# Patient Record
Sex: Male | Born: 2000 | Race: Black or African American | Hispanic: No | Marital: Single | Smoking: Never smoker
Health system: Southern US, Community
[De-identification: ages and names within clinical notes are randomized; demographics above are authoritative.]

## PROBLEM LIST (undated history)

## (undated) DIAGNOSIS — N390 Urinary tract infection, site not specified: Secondary | ICD-10-CM

## (undated) DIAGNOSIS — L210 Seborrhea capitis: Secondary | ICD-10-CM

## (undated) DIAGNOSIS — T7840XA Allergy, unspecified, initial encounter: Secondary | ICD-10-CM

## (undated) DIAGNOSIS — N3944 Nocturnal enuresis: Secondary | ICD-10-CM

## (undated) DIAGNOSIS — J45909 Unspecified asthma, uncomplicated: Secondary | ICD-10-CM

## (undated) DIAGNOSIS — F909 Attention-deficit hyperactivity disorder, unspecified type: Secondary | ICD-10-CM

## (undated) HISTORY — DX: Attention-deficit hyperactivity disorder, unspecified type: F90.9

## (undated) HISTORY — DX: Nocturnal enuresis: N39.44

## (undated) HISTORY — DX: Allergy, unspecified, initial encounter: T78.40XA

## (undated) HISTORY — DX: Urinary tract infection, site not specified: N39.0

## (undated) HISTORY — DX: Unspecified asthma, uncomplicated: J45.909

## (undated) HISTORY — PX: WISDOM TOOTH EXTRACTION: SHX21

## (undated) HISTORY — DX: Seborrhea capitis: L21.0

---
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
## 2020-01-11 ENCOUNTER — Emergency Department (HOSPITAL_COMMUNITY)
Admission: EM | Admit: 2020-01-11 | Discharge: 2020-01-11 | Disposition: A | Discharge status: Home / Self Care | Payer: No Typology Code available for payment source | Attending: Emergency Medicine | Admitting: Emergency Medicine

## 2020-01-11 ENCOUNTER — Encounter (HOSPITAL_COMMUNITY): Payer: Self-pay

## 2020-01-11 ENCOUNTER — Other Ambulatory Visit: Payer: Self-pay

## 2020-01-11 DIAGNOSIS — X500XXA Overexertion from strenuous movement or load, initial encounter: Secondary | ICD-10-CM | POA: Diagnosis not present

## 2020-01-11 DIAGNOSIS — Z7951 Long term (current) use of inhaled steroids: Secondary | ICD-10-CM | POA: Insufficient documentation

## 2020-01-11 DIAGNOSIS — S76212A Strain of adductor muscle, fascia and tendon of left thigh, initial encounter: Secondary | ICD-10-CM | POA: Diagnosis not present

## 2020-01-11 DIAGNOSIS — J4540 Moderate persistent asthma, uncomplicated: Secondary | ICD-10-CM | POA: Insufficient documentation

## 2020-01-11 DIAGNOSIS — Y9389 Activity, other specified: Secondary | ICD-10-CM | POA: Insufficient documentation

## 2020-01-11 DIAGNOSIS — Y990 Civilian activity done for income or pay: Secondary | ICD-10-CM | POA: Insufficient documentation

## 2020-01-11 DIAGNOSIS — Y92513 Shop (commercial) as the place of occurrence of the external cause: Secondary | ICD-10-CM | POA: Insufficient documentation

## 2020-01-11 DIAGNOSIS — R1032 Left lower quadrant pain: Secondary | ICD-10-CM | POA: Diagnosis present

## 2020-01-11 NOTE — ED Triage Notes (Addendum)
Patient states he hurt his left groin after lifting a heavy box at work today.

## 2020-01-11 NOTE — Discharge Instructions (Signed)
You have been seen here for groin pain, I recommend taking over-the-counter pain medications like ibuprofen and/or Tylenol every 6 as needed.  Please follow dosage and on the back of bottle.  I also recommend applying ice to the area as this can decrease swelling inflammation.  I would avoid lifting heavy objects for the first week and allow your body to rest.  I would follow-up with your PCP for further evaluation management.  Come back to the emergency department if you develop chest pain, shortness of breath, severe abdominal pain, uncontrolled nausea, vomiting, diarrhea.

## 2020-01-11 NOTE — ED Provider Notes (Signed)
[facility]-EMERGENCY DEPT Provider Note   CSN: [id number] Arrival date & time: 01/11/20  1631     History Chief Complaint  Patient presents with  . Groin Injury    Cameron Stafford is a 19 y.o. male.  HPI   Patient with no significant medical history presents to the emergency department with chief complaint of left-sided groin pain.  Patient states he works at [employer] where he was lifting up heavy boxes today.  He states while lifting a heavy box he felt pain in his left groin.  He states he continued with work but the pain worsened with walking and sitting down.  He denies dysuria, urinary retention, urinary urgency, hematuria, penile discharge, testicular pain.  He denies hitting his head, lose conscious, being on any anticoags.  He denies seeing any bulge in his groin area and is not taking any pain medications.  Patient denies headache, fever, chills, shortness of breath, chest pain, dumping, nausea, vomiting, diarrhea, pedal edema.  Past Medical History:  Diagnosis Date  . ADHD (attention deficit hyperactivity disorder)   . Allergy   . Asthma   . Dandruff 01/16/2013  . Nocturnal enuresis   . Urinary tract infection     Patient Active Problem List   Diagnosis Date Noted  . Moderate persistent asthma 05/23/2015  . Allergic rhinitis due to pollen 05/23/2015  . Inadequate sleep hygiene 12/30/2014  . Asthma, moderate persistent, well-controlled 02/23/2014  . Allergic rhinitis 02/23/2014  . 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 Nocturnal enuresis with recurrent UTIs 08/28/2012  . Constipation 08/28/2012    Past Surgical History:  Procedure Laterality Date  . WISDOM TOOTH EXTRACTION         Family History  Problem Relation Age of Onset  . Asthma Mother   . Sjogren's syndrome Mother   . Rheum arthritis Mother   . Asthma Maternal Uncle   . Asthma Maternal Grandmother   . Kidney disease Maternal Grandfather   . Allergic rhinitis Sister    . Angioedema Neg Hx   . Atopy Neg Hx   . Eczema Neg Hx   . Immunodeficiency Neg Hx   . Urticaria Neg Hx     Social History   Tobacco Use  . Smoking status: Never Smoker  . Smokeless tobacco: Never Used  Vaping Use  . Vaping Use: Never used  Substance Use Topics  . Alcohol use: No    Alcohol/week: 0.0 standard drinks  . Drug use: No    Home Medications Prior to Admission medications   Medication Sig Start Date End Date Taking? Authorizing Provider  albuterol (PROAIR HFA) 108 (90 Base) MCG/ACT inhaler Inhale 2 puffs into the lungs every 4 (four) hours as needed for wheezing or shortness of breath. 04/11/16   Marcelyn Bruins, MD  albuterol (PROVENTIL) (2.5 MG/3ML) 0.083% nebulizer solution USE ONE VIAL IN NEBULIZER EVERY 6 HOURS AS NEEDED FOR WHEEZING OR FOR SHORTNESS OF BREATH 07/11/16   Kozlow, Alvira Philips, MD  beclomethasone (QVAR) 80 MCG/ACT inhaler Inhale 2 puffs into the lungs 2 (two) times daily. 04/11/16   Marcelyn Bruins, MD  cetirizine (ZYRTEC) 10 MG tablet Take 1 tablet (10 mg total) by mouth daily. 04/11/16   Marcelyn Bruins, MD  EPINEPHrine (EPIPEN 2-PAK) 0.3 mg/0.3 mL IJ SOAJ injection Inject 0.3 mg into the muscle once.    [provider]  EPINEPHrine 0.3 mg/0.3 mL IJ SOAJ injection Inject 0.3 mLs (0.3 mg total) into the muscle once. 05/23/15  Fletcher Anon, MD  fluticasone (FLONASE) 50 MCG/ACT nasal spray Place 2 sprays into both nostrils daily. 04/11/16   Marcelyn Bruins, MD  lisdexamfetamine (VYVANSE) 30 MG capsule Take 1 capsule (30 mg total) by mouth daily with breakfast. 10/11/15   Georges Mouse, NP  montelukast (SINGULAIR) 10 MG tablet Take 1 tablet (10 mg total) by mouth at bedtime. 04/11/16   Marcelyn Bruins, MD  mupirocin ointment (BACTROBAN) 2 % Apply 1 application topically 3 (three) times daily. 12/08/17   Lutricia Feil, PA-C  polyethylene glycol powder (GLYCOLAX/MIRALAX) powder 1 capfull in 8 ounces  fluid 1-2 times daily as needed for constipation 02/25/15   Kalman Jewels, MD    Allergies    Amoxicillin  Review of Systems   Review of Systems  Constitutional: Negative for chills and fever.  HENT: Negative for congestion, tinnitus, trouble swallowing and voice change.   Eyes: Negative for visual disturbance.  Respiratory: Negative for shortness of breath.   Cardiovascular: Negative for chest pain.  Gastrointestinal: Negative for abdominal pain, diarrhea, nausea and vomiting.  Genitourinary: Negative for dysuria, enuresis and flank pain.       Admits to left groin pain.  Musculoskeletal: Negative for back pain.  Skin: Negative for rash.  Neurological: Negative for dizziness, numbness and headaches.  Hematological: Does not bruise/bleed easily.    Physical Exam Updated Vital Signs BP 108/74 (BP Location: Right Arm)   Pulse 71   Temp 98.5 F (36.9 C) (Oral)   Resp 17   Ht 6\' 2"  (1.88 m)   Wt 74.8 kg   SpO2 100%   BMI 21.18 kg/m   Physical Exam Vitals and nursing note reviewed. Exam conducted with a chaperone present.  Constitutional:      General: He is not in acute distress.    Appearance: Normal appearance. He is not ill-appearing or diaphoretic.  HENT:     Head: Normocephalic and atraumatic.     Nose: No congestion or rhinorrhea.  Eyes:     General: No scleral icterus.       Right eye: No discharge.        Left eye: No discharge.     Conjunctiva/sclera: Conjunctivae normal.  Pulmonary:     Effort: Pulmonary effort is normal. No respiratory distress.     Breath sounds: Normal breath sounds. No wheezing.  Genitourinary:    Penis: Normal.      Testes: Normal.     Comments: With chaperone present genital exam was performed, no gross abnormalities noted, no discharge or drainage noted.  Patient testicles were palpated nontender to palpation, no Bell Clapper deformity noted.  Cremaster reflex was intact.  No bulge felt at the external ring with  cough. Musculoskeletal:     Cervical back: Neck supple.     Right lower leg: No edema.     Left lower leg: No edema.  Skin:    General: Skin is warm and dry.     Coloration: Skin is not jaundiced or pale.  Neurological:     Mental Status: He is alert and oriented to person, place, and time.  Psychiatric:        Mood and Affect: Mood normal.     ED Results / Procedures / Treatments   Labs (all labs ordered are listed, but only abnormal results are displayed) Labs Reviewed - No data to display  EKG None  Radiology No results found.  Procedures Procedures (including critical care time)  Medications Ordered in ED  Medications - No data to display  ED Course  I have reviewed the triage vital signs and the nursing notes.  Pertinent labs & imaging results that were available during my care of the patient were reviewed by me and considered in my medical decision making (see chart for details).    MDM Rules/Calculators/A&P                          I have personally reviewed all imaging, labs and have interpreted them.  Patient presents with left groin pain.  He is alert, did not appear in acute distress, vital signs reassuring.  Due to well-appearing patient, benign physical exam, reassuring vital signs further lab and imaging not warranted at this time.  I have low suspicion for testicular torsion or epididymitis as patient denies testicular pain, testicles were palpated they are nontender to palpation, no deformities noted.  Low suspicion for hernia as external ring was palpated with cough no bulge felt, lower pelvic region was palpated no bulges or masses noted.  Low suspicion for UTI or pyelonephritis as denies urinary symptoms, no CVA tenderness on exam.  Low suspicion for STD as patient denies penile discharge and there is no testicle tenderness on exam.  I suspect patient may have had a hernia that spontaneously reduced or possible pelvic strain.  Will recommend  over-the-counter pain medications and follow-up with PCP for further evaluation.  Vital signs have remained stable, no indication for hospital admission. Patient given at home care as well strict return precautions.  Patient verbalized that they understood agreed to said plan.   Final Clinical Impression(s) / ED Diagnoses Final diagnoses:  Inguinal strain, left, initial encounter    Rx / DC Orders ED Discharge Orders    None       Carroll Sage, PA-C 01/11/20 1759    Charlynne Pander, MD 01/13/20 [phone]

## 2023-03-13 ENCOUNTER — Ambulatory Visit (INDEPENDENT_AMBULATORY_CARE_PROVIDER_SITE_OTHER): Payer: BC Managed Care – PPO

## 2023-03-13 ENCOUNTER — Ambulatory Visit (HOSPITAL_COMMUNITY)
Admission: EM | Admit: 2023-03-13 | Discharge: 2023-03-13 | Disposition: A | Discharge status: Home / Self Care | Payer: BC Managed Care – PPO | Attending: Physician Assistant | Admitting: Physician Assistant

## 2023-03-13 ENCOUNTER — Encounter (HOSPITAL_COMMUNITY): Payer: Self-pay

## 2023-03-13 DIAGNOSIS — S5011XA Contusion of right forearm, initial encounter: Secondary | ICD-10-CM

## 2023-03-13 DIAGNOSIS — S59911A Unspecified injury of right forearm, initial encounter: Secondary | ICD-10-CM

## 2023-03-13 MED ORDER — IBUPROFEN 600 MG PO TABS: 600.0000 mg | ORAL_TABLET | Freq: Three times a day (TID) | ORAL | 0 refills | Status: AC | PRN

## 2023-03-13 NOTE — Discharge Instructions (Signed)
 I not see any evidence of broken bone.  I will contact you if the radiologist sees something that I did not and it changes our treatment plan.  Keep your arm elevated and use ice for 15 minutes at a time several times per day.  Use the Ace bandage for support.  Take ibuprofen  for pain.  Do not take NSAIDs with this medication including aspirin, ibuprofen /Advil , naproxen/Aleve.  You can use Tylenol/acetaminophen as needed.  If your symptoms are not improving please follow-up with sports medicine; call to schedule an appointment.  If anything worsen she should be seen immediately including increasing pain, swelling, numbness or tingling in the hand.

## 2023-03-13 NOTE — ED Provider Notes (Signed)
 [facility]    CSN: [id number] Arrival date & time: 03/13/23  1815      History   Chief Complaint Chief Complaint  Patient presents with   Arm Injury    HPI Cameron Stafford is a 23 y.o. male.   Patient presents today for evaluation of right arm injury.  Reports he was using an air gun earlier today at work when this slipped and impacted his right forearm.  He did not initially have pain but this gradually worsened to a 6 on a 0 to pain scale, described as throbbing, worse with certain movements of the wrist/hand, no alleviating factors identified.  He has applied topical medication with temporary improvement of symptoms.  He is right-handed.  Denies any numbness or tingling of fingers.  Denies previous injury or surgery involving his hand.    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 Authorizing Provider  ibuprofen  (ADVIL ) 600 MG tablet Take 1 tablet (600 mg total) by mouth every 8 (eight) hours as needed. 03/13/23  Yes Ismeal Heider K, PA-C  albuterol  (PROAIR  HFA) 108 (90 Base) MCG/ACT inhaler Inhale 2 puffs into the lungs every 4 (four) hours as needed for wheezing or shortness of breath. 04/11/16   Jeneal Danita Macintosh, MD  albuterol  (PROVENTIL )  (2.5 MG/3ML) 0.083% nebulizer solution USE ONE VIAL IN NEBULIZER EVERY 6 HOURS AS NEEDED FOR WHEEZING OR FOR SHORTNESS OF BREATH 07/11/16   Kozlow, Camellia PARAS, MD  beclomethasone (QVAR ) 80 MCG/ACT inhaler Inhale 2 puffs into the lungs 2 (two) times daily. 04/11/16   Jeneal Danita Macintosh, MD  cetirizine  (ZYRTEC ) 10 MG tablet Take 1 tablet (10 mg total) by mouth daily. 04/11/16   Jeneal Danita Macintosh, MD  EPINEPHrine  (EPIPEN  2-PAK) 0.3 mg/0.3 mL IJ SOAJ injection Inject 0.3 mg into the muscle once.    [provider]    Family History Family History  Problem Relation Age of Onset   Asthma Mother    Sjogren's syndrome Mother    Rheum arthritis Mother    Asthma Maternal Uncle    Asthma Maternal Grandmother    Kidney disease Maternal Grandfather    Allergic rhinitis Sister    Angioedema Neg Hx    Atopy Neg Hx    Eczema Neg Hx    Immunodeficiency Neg Hx    Urticaria Neg Hx     Social History Social History   Tobacco Use   Smoking status: Never   Smokeless tobacco: Never  Vaping Use   Vaping status: Never Used  Substance Use Topics   Alcohol use: No    Alcohol/week: 0.0 standard drinks of alcohol   Drug use: Never     Allergies   Amoxicillin   Review  of Systems Review of Systems  Constitutional:  Positive for activity change. Negative for appetite change, fatigue and fever.  Musculoskeletal:  Positive for arthralgias. Negative for myalgias.  Skin:  Negative for color change and wound.  Neurological:  Negative for weakness and numbness.     Physical Exam Triage Vital Signs ED Triage Vitals  Encounter Vitals Group     BP 03/13/23 1850 112/71     Systolic BP Percentile --      Diastolic BP Percentile --      Pulse Rate 03/13/23 1850 74     Resp 03/13/23 1850 16     Temp 03/13/23 1850 98.2 F (36.8 C)     Temp Source 03/13/23 1850 Oral     SpO2 03/13/23 1850 98 %     Weight 03/13/23 1850 170 lb (77.1 kg)     Height 03/13/23 1850 6' 2 (1.88 m)      Head Circumference --      Peak Flow --      Pain Score 03/13/23 1846 6     Pain Loc --      Pain Education --      Exclude from Growth Chart --    No data found.  Updated Vital Signs BP 112/71 (BP Location: Right Arm)   Pulse 74   Temp 98.2 F (36.8 C) (Oral)   Resp 16   Ht 6' 2 (1.88 m)   Wt 170 lb (77.1 kg)   SpO2 98%   BMI 21.83 kg/m   Visual Acuity Right Eye Distance:   Left Eye Distance:   Bilateral Distance:    Right Eye Near:   Left Eye Near:    Bilateral Near:     Physical Exam Vitals reviewed.  Constitutional:      General: He is awake.     Appearance: Normal appearance. He is well-developed. He is not ill-appearing.     Comments: Very pleasant male appears stated age in no acute distress sitting comfortably in exam room  HENT:     Head: Normocephalic and atraumatic.     Mouth/Throat:     Pharynx: No oropharyngeal exudate, posterior oropharyngeal erythema or uvula swelling.  Cardiovascular:     Rate and Rhythm: Normal rate and regular rhythm.     Heart sounds: Normal heart sounds, S1 normal and S2 normal. No murmur heard.    Comments: Capillary refill within 2 seconds bilateral fingers Pulmonary:     Effort: Pulmonary effort is normal.     Breath sounds: Normal breath sounds. No stridor. No wheezing, rhonchi or rales.     Comments: Clear to auscultation bilaterally Musculoskeletal:     Right forearm: Swelling, deformity, tenderness and bony tenderness present. No lacerations.     Comments: Right arm/wrist/hand: Swelling and deformity noted distal radius that is tender to palpation.  No snuffbox tenderness or pain with movement of wrist.  Hand is neurovascularly intact.  Normal pincer and grip strength.  Neurological:     Mental Status: He is alert.  Psychiatric:        Behavior: Behavior is cooperative.      UC Treatments / Results  Labs (all labs ordered are listed, but only abnormal results are displayed) Labs Reviewed - No data to  display  EKG   Radiology No results found.  Procedures Procedures (including critical care time)  Medications Ordered in UC Medications - No data to display  Initial Impression / Assessment and Plan / UC Course  I have  reviewed the triage vital signs and the nursing notes.  Pertinent labs & imaging results that were available during my care of the patient were reviewed by me and considered in my medical decision making (see chart for details).     Patient is well-appearing, afebrile, nontoxic, nontachycardic.  X-ray was obtained that showed no acute osseous abnormality based on my primary read.  At the time of discharge we were waiting for radiologist over read and we will contact him if this differs from my reading changes our treatment plan.  He was encouraged to use RICE protocol for additional symptom relief and was placed in an Ace bandage for comfort and compression.  He was given ibuprofen  for pain relief and we discussed that he is not to take additional NSAIDs with this medication but can use Tylenol/acetaminophen as needed.  He is to follow-up with sports medicine if his symptoms not improving quickly.  We discussed that if anything worsens or changes and he has increasing pain, swelling, numbness or paresthesias in the hand he should be seen immediately.  Strict return precautions given.  Work excuse note provided.  Final Clinical Impressions(s) / UC Diagnoses   Final diagnoses:  Injury of right forearm, initial encounter  Contusion of right forearm, initial encounter     Discharge Instructions      I not see any evidence of broken bone.  I will contact you if the radiologist sees something that I did not and it changes our treatment plan.  Keep your arm elevated and use ice for 15 minutes at a time several times per day.  Use the Ace bandage for support.  Take ibuprofen  for pain.  Do not take NSAIDs with this medication including aspirin, ibuprofen /Advil , naproxen/Aleve.   You can use Tylenol/acetaminophen as needed.  If your symptoms are not improving please follow-up with sports medicine; call to schedule an appointment.  If anything worsen she should be seen immediately including increasing pain, swelling, numbness or tingling in the hand.     ED Prescriptions     Medication Sig Dispense Auth. Provider   ibuprofen  (ADVIL ) 600 MG tablet Take 1 tablet (600 mg total) by mouth every 8 (eight) hours as needed. 30 tablet Sandralee Tarkington K, PA-C      PDMP not reviewed this encounter.   Sherrell Rocky POUR, PA-C 03/13/23 1956

## 2023-03-13 NOTE — ED Triage Notes (Addendum)
 Right arm injury today. 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 It slipped off and struck him on the inner right forearm. The area is swollen but not as bad as earlier.   Patient tried icy hot gel with little relief. Unable to hold anything with the right arm.
